# Patient Record
Sex: Female | Born: 1960 | Hispanic: No | Marital: Married
Health system: Midwestern US, Academic
[De-identification: ages and names within clinical notes are randomized; demographics above are authoritative.]

---

## 2018-05-07 LAB — LIPID PROFILE
Lab: 120 — ABNORMAL HIGH (ref <100)
Lab: 203 — ABNORMAL HIGH (ref <200)
Lab: 66 — ABNORMAL HIGH (ref 40–60)

## 2018-05-07 LAB — GLUCOSE, RANDOM: Lab: 83

## 2018-07-22 ENCOUNTER — Encounter: Admit: 2018-07-22 | Discharge: 2018-07-22 | Payer: BC Managed Care – PPO

## 2018-07-25 ENCOUNTER — Encounter: Admit: 2018-07-25 | Discharge: 2018-07-25 | Payer: BC Managed Care – PPO

## 2018-07-25 DIAGNOSIS — R0600 Dyspnea, unspecified: Secondary | ICD-10-CM

## 2018-07-25 DIAGNOSIS — E669 Obesity, unspecified: Secondary | ICD-10-CM

## 2018-07-29 ENCOUNTER — Ambulatory Visit: Admit: 2018-07-29 | Discharge: 2018-07-29 | Payer: BC Managed Care – PPO

## 2018-07-29 ENCOUNTER — Encounter: Admit: 2018-07-29 | Discharge: 2018-07-29 | Payer: BC Managed Care – PPO

## 2018-07-29 DIAGNOSIS — J9859 Other diseases of mediastinum, not elsewhere classified: Secondary | ICD-10-CM

## 2018-07-29 DIAGNOSIS — E669 Obesity, unspecified: Secondary | ICD-10-CM

## 2018-07-29 DIAGNOSIS — R030 Elevated blood-pressure reading, without diagnosis of hypertension: Secondary | ICD-10-CM

## 2018-07-29 DIAGNOSIS — R0600 Dyspnea, unspecified: Secondary | ICD-10-CM

## 2018-08-04 ENCOUNTER — Ambulatory Visit: Admit: 2018-08-04 | Discharge: 2018-08-05 | Payer: BC Managed Care – PPO

## 2018-08-04 DIAGNOSIS — R0600 Dyspnea, unspecified: Secondary | ICD-10-CM

## 2018-08-04 MED ORDER — PERFLUTREN LIPID MICROSPHERES 1.1 MG/ML IV SUSP
1-20 mL | Freq: Once | INTRAVENOUS | 0 refills | Status: CP | PRN
Start: 2018-08-04 — End: ?

## 2018-08-05 ENCOUNTER — Encounter: Admit: 2018-08-05 | Discharge: 2018-08-05 | Payer: BC Managed Care – PPO

## 2018-08-08 ENCOUNTER — Encounter: Admit: 2018-08-08 | Discharge: 2018-08-08 | Payer: BC Managed Care – PPO

## 2018-08-08 DIAGNOSIS — J9859 Other diseases of mediastinum, not elsewhere classified: Secondary | ICD-10-CM

## 2018-08-15 ENCOUNTER — Encounter: Admit: 2018-08-15 | Discharge: 2018-08-15 | Payer: BC Managed Care – PPO

## 2018-08-15 DIAGNOSIS — R030 Elevated blood-pressure reading, without diagnosis of hypertension: Secondary | ICD-10-CM

## 2018-08-15 DIAGNOSIS — Z01812 Encounter for preprocedural laboratory examination: Secondary | ICD-10-CM

## 2018-08-20 ENCOUNTER — Encounter: Admit: 2018-08-20 | Discharge: 2018-08-20 | Payer: BC Managed Care – PPO

## 2018-08-20 DIAGNOSIS — Z01812 Encounter for preprocedural laboratory examination: Secondary | ICD-10-CM

## 2018-08-20 DIAGNOSIS — R030 Elevated blood-pressure reading, without diagnosis of hypertension: Secondary | ICD-10-CM

## 2018-08-20 LAB — CREATININE: Lab: 0.8

## 2018-08-20 LAB — BUN: Lab: 22 — ABNORMAL HIGH (ref 9.8–20.1)

## 2018-08-21 ENCOUNTER — Encounter: Admit: 2018-08-21 | Discharge: 2018-08-21 | Payer: BC Managed Care – PPO

## 2018-08-21 MED ORDER — RANOLAZINE 500 MG PO TB12
500 mg | ORAL_TABLET | Freq: Two times a day (BID) | ORAL | 3 refills | Status: AC
Start: 2018-08-21 — End: 2018-12-04

## 2018-08-25 ENCOUNTER — Encounter: Admit: 2018-08-25 | Discharge: 2018-08-25 | Payer: BC Managed Care – PPO

## 2018-10-06 ENCOUNTER — Encounter: Admit: 2018-10-06 | Discharge: 2018-10-06 | Payer: BC Managed Care – PPO

## 2018-11-27 ENCOUNTER — Encounter: Admit: 2018-11-27 | Discharge: 2018-11-27 | Payer: BC Managed Care – PPO

## 2018-12-03 ENCOUNTER — Encounter: Admit: 2018-12-03 | Discharge: 2018-12-03 | Payer: BC Managed Care – PPO

## 2018-12-04 ENCOUNTER — Encounter: Admit: 2018-12-04 | Discharge: 2018-12-04 | Payer: BC Managed Care – PPO

## 2018-12-04 ENCOUNTER — Ambulatory Visit: Admit: 2018-12-04 | Discharge: 2018-12-05 | Payer: BC Managed Care – PPO

## 2018-12-04 DIAGNOSIS — R0600 Dyspnea, unspecified: Principal | ICD-10-CM

## 2018-12-04 DIAGNOSIS — R030 Elevated blood-pressure reading, without diagnosis of hypertension: Principal | ICD-10-CM

## 2018-12-04 DIAGNOSIS — E669 Obesity, unspecified: ICD-10-CM

## 2018-12-04 MED ORDER — LOSARTAN-HYDROCHLOROTHIAZIDE 50-12.5 MG PO TAB
1 | ORAL_TABLET | Freq: Every morning | ORAL | 3 refills | 28.00000 days | Status: DC
Start: 2018-12-04 — End: 2019-08-07

## 2018-12-04 NOTE — Patient Instructions
1.  Goal is an average of 130/80 or less  2.  Losartan-hydrochlorothiazide 50-12.5/day for blood pressure.  Rx sent to [facility]

## 2018-12-04 NOTE — Progress Notes
SHe's willing to start losartan-HCTZ for her blood pressure.  I sent Rx in today.    Dyspnea  WE haven't found an explanation for her exercise intolerance other than her blood pressure.  She's now willing to try a BP medication.      Current Medications (including today's revisions)  ??? losartan-hydrochlorothiazide (HYZAAR) 50-12.5 mg tablet Take one tablet by mouth every morning.   ??? NO HOME MEDICATIONS

## 2018-12-24 ENCOUNTER — Encounter: Admit: 2018-12-24 | Discharge: 2018-12-24

## 2018-12-26 ENCOUNTER — Encounter: Admit: 2018-12-26 | Discharge: 2018-12-26

## 2018-12-26 NOTE — Patient Instructions
Hydrochlorothiazide, HCTZ; Losartan tablets  Brand Name: Hyzaar  What is this medicine?  LOSARTAN; HYDROCHLOROTHIAZIDE (loe SAR tan; hye droe klor oh THYE a zide) is a combination of a drug that relaxes blood vessels and a diuretic. It is used to treat high blood pressure. This medicine may also reduce the risk of stroke in certain patients.  How should I use this medicine?  Take this medicine by mouth with a glass of water. Follow the directions on the prescription label. You can take it with or without food. If it upsets your stomach, take it with food. Take your medicine at regular intervals. Do not take it more often than directed. Do not stop taking except on your doctor's advice.  Talk to your pediatrician regarding the use of this medicine in children. Special care may be needed.  What side effects may I notice from receiving this medicine?  Side effects that you should report to your doctor or health care professional as soon as possible:  · allergic reactions like skin rash, itching or hives, swelling of the face, lips, or tongue  · breathing problems  · changes in vision  · dark urine  · eye pain  · fast or irregular heart beat, palpitations, or chest pain  · feeling faint or lightheaded  · muscle cramps  · persistent dry cough  · redness, blistering, peeling or loosening of the skin, including inside the mouth  · stomach pain  · trouble passing urine or change in the amount of urine  · unusual bleeding or bruising  · worsened gout pain  · yellowing of the eyes or skin  Side effects that usually do not require medical attention (report to your doctor or health care professional if they continue or are bothersome):  · change in sex drive or performance  · headache  What may interact with this medicine?  · barbiturates, like phenobarbital  · blood pressure medicines  · celecoxib  · cimetidine  · corticosteroids  · diabetic medicines  · diuretics, especially triamterene, spironolactone or amiloride  ·  fluconazole  · lithium  · NSAIDs, medicines for pain and inflammation, like ibuprofen or naproxen  · potassium salts or potassium supplements  · prescription pain medicines  · rifampin  · skeletal muscle relaxants like tubocurarine  · some cholesterol-lowering medicines like cholestyramine or colestipol    What if I miss a dose?  If you miss a dose, take it as soon as you can. If it is almost time for your next dose, take only that dose. Do not take double or extra doses.  Where should I keep my medicine?  Keep out of the reach of children.  Store at room temperature between 15 and 30 degrees C (59 and 86 degrees F). Protect from light. Keep container tightly closed. Throw away any unused medicine after the expiration date.  What should I tell my health care provider before I take this medicine?  They need to know if you have any of these conditions:  · decreased urine  · kidney disease  · liver disease  · if you are on a special diet, like a low-salt diet  · immune system problems, like lupus  · an unusual or allergic reaction to losartan, hydrochlorothiazide, sulfa drugs, other medicines, foods, dyes, or preservatives  · pregnant or trying to get pregnant  · breast-feeding  What should I watch for while using this medicine?  Check your blood pressure regularly while you are taking this   medicine. Ask your doctor or health care professional what your blood pressure should be and when you should contact him or her. When you check your blood pressure, write down the measurements to show your doctor or health care professional. If you are taking this medicine for a long time, you must visit your health care professional for regular checks on your progress. Make sure you schedule appointments on a regular basis.  You must not get dehydrated. Ask your doctor or health care professional how much fluid you need to drink a day. Check with him or her if you get an attack of severe diarrhea, nausea and vomiting, or if you  sweat a lot. The loss of too much body fluid can make it dangerous for you to take this medicine.  Women should inform their doctor if they wish to become pregnant or think they might be pregnant. There is a potential for serious side effects to an unborn child, particularly in the second or third trimester. Talk to your health care professional or pharmacist for more information.  You may get drowsy or dizzy. Do not drive, use machinery, or do anything that needs mental alertness until you know how this drug affects you. Do not stand or sit up quickly, especially if you are an older patient. This reduces the risk of dizzy or fainting spells. Alcohol can make you more drowsy and dizzy. Avoid alcoholic drinks.  This medicine may affect your blood sugar level. If you have diabetes, check with your doctor or health care professional before changing the dose of your diabetic medicine.  Avoid salt substitutes unless you are told otherwise by your doctor or health care professional.  Do not treat yourself for coughs, colds, or pain while you are taking this medicine without asking your doctor or health care professional for advice. Some ingredients may increase your blood pressure.  NOTE:This sheet is a summary. It may not cover all possible information. If you have questions about this medicine, talk to your doctor, pharmacist, or health care provider. Copyright© 2020 [employer]

## 2019-07-09 ENCOUNTER — Encounter: Admit: 2019-07-09 | Discharge: 2019-07-09 | Payer: BC Managed Care – PPO

## 2019-07-09 DIAGNOSIS — E669 Obesity, unspecified: Secondary | ICD-10-CM

## 2019-07-09 DIAGNOSIS — R0600 Dyspnea, unspecified: Secondary | ICD-10-CM

## 2019-08-07 ENCOUNTER — Encounter: Admit: 2019-08-07 | Discharge: 2019-08-07 | Payer: BC Managed Care – PPO

## 2019-08-07 MED ORDER — LOSARTAN-HYDROCHLOROTHIAZIDE 50-12.5 MG PO TAB
1 | ORAL_TABLET | Freq: Every morning | ORAL | 3 refills | 28.00000 days | Status: AC
Start: 2019-08-07 — End: ?

## 2019-08-07 NOTE — Telephone Encounter
Patient called regarding bp's said that she had not been taking bp medication for the past few days because SDO was to review her bp reading and get back with her.  She said bp readings were on her phone.  Listed the lowest bp as 104/80 -129/95, and listed 132/85 refilled current meds

## 2019-09-01 ENCOUNTER — Encounter: Admit: 2019-09-01 | Discharge: 2019-09-01 | Payer: BC Managed Care – PPO

## 2019-09-01 NOTE — Progress Notes
Patient dropped off blood pressure readings. Readings appear mostly within normal range.  No needs at this time.

## 2020-08-08 IMAGING — CR CHEST
3 series · 3 of 3 positions shown · non-contrast
Comparison: none

[rib upper]
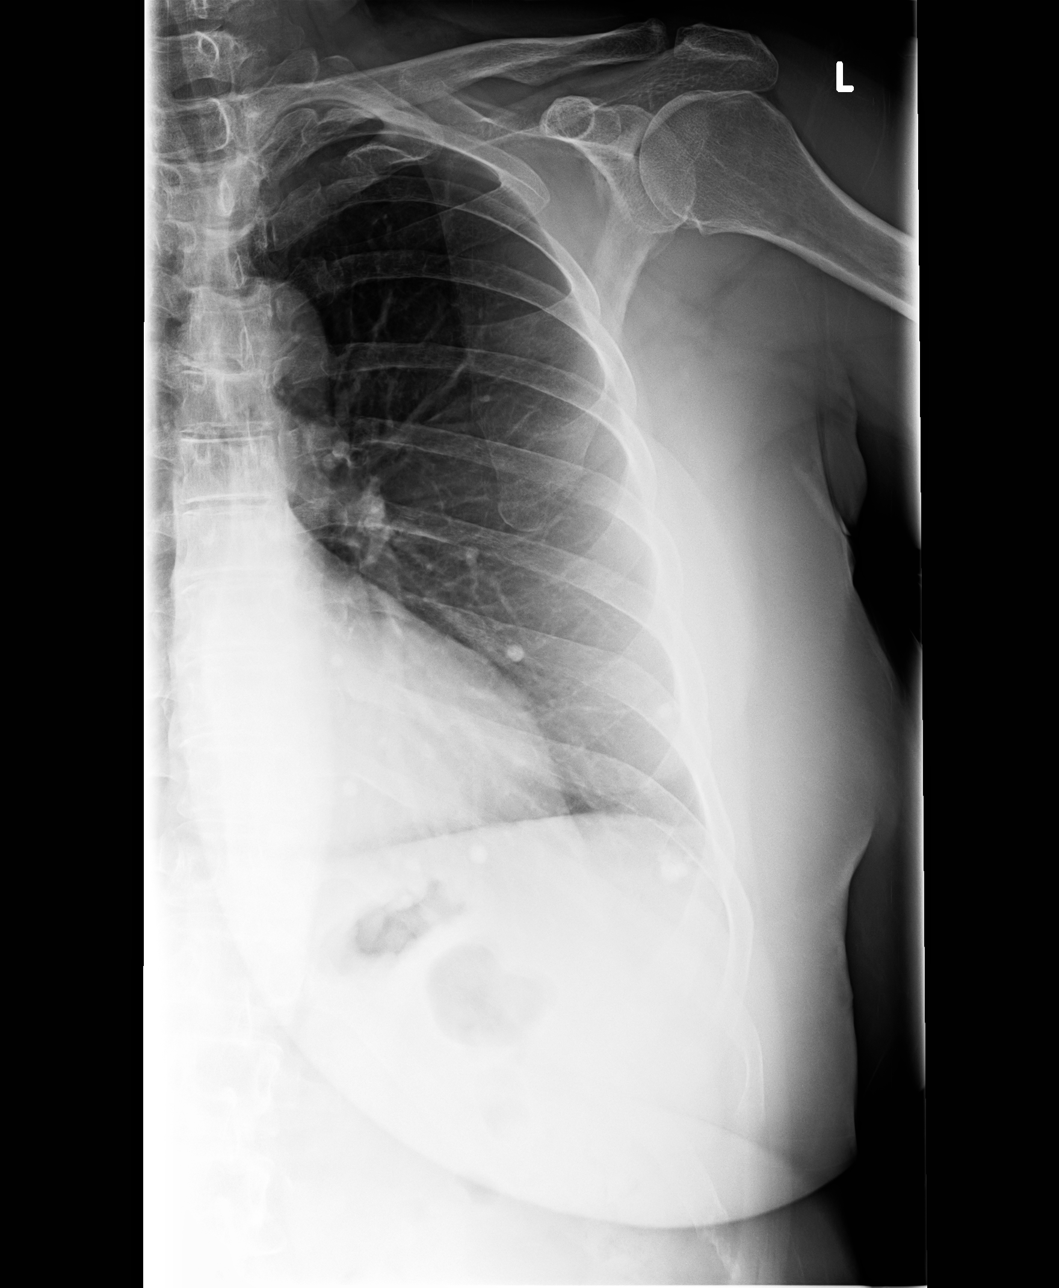

[rib upper obl]
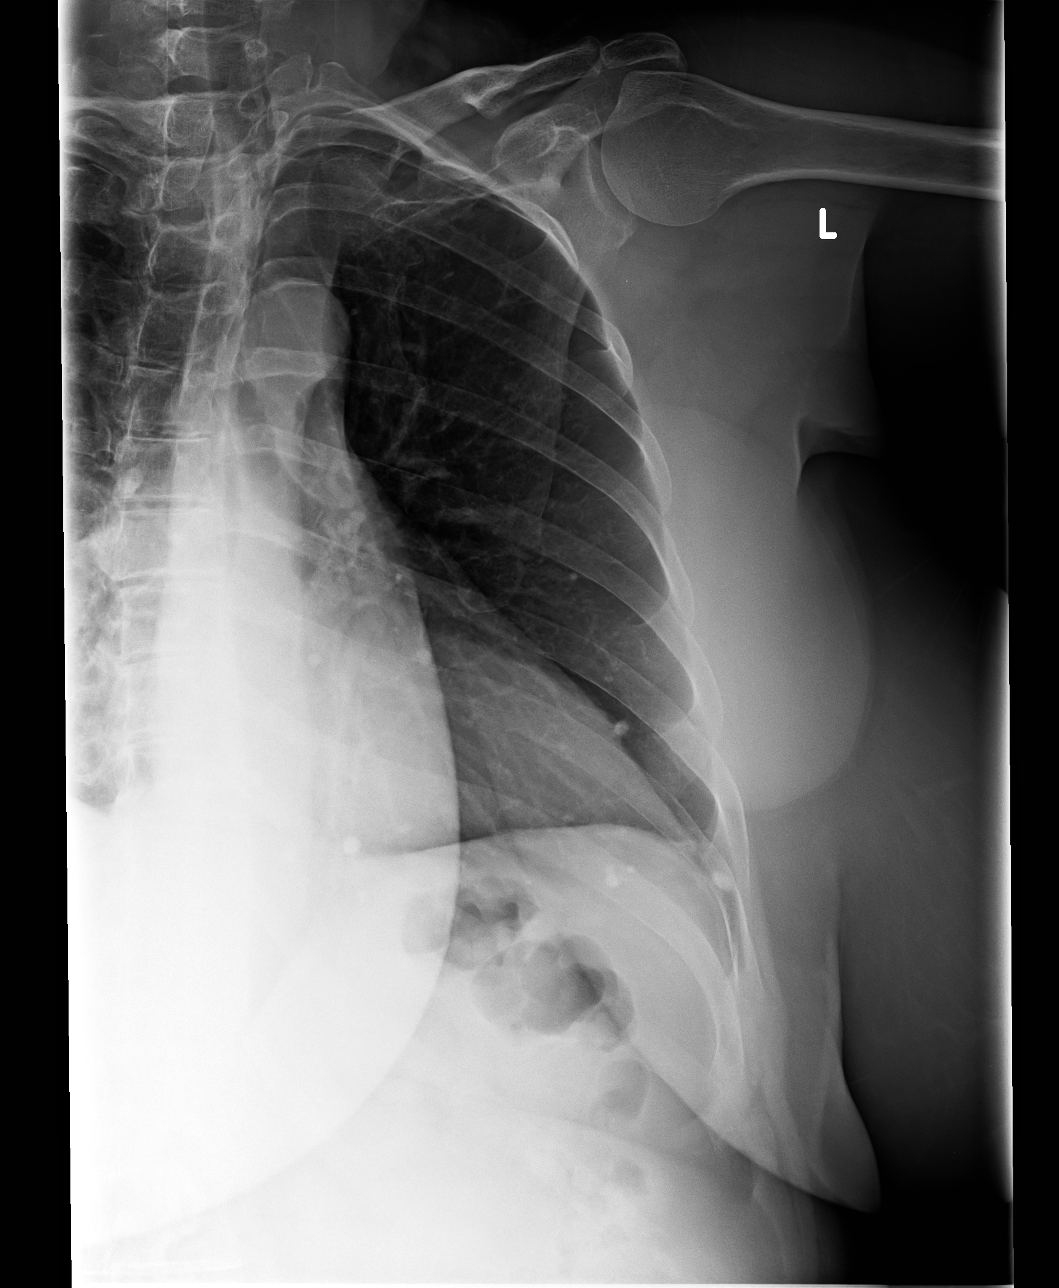

[rib lower]
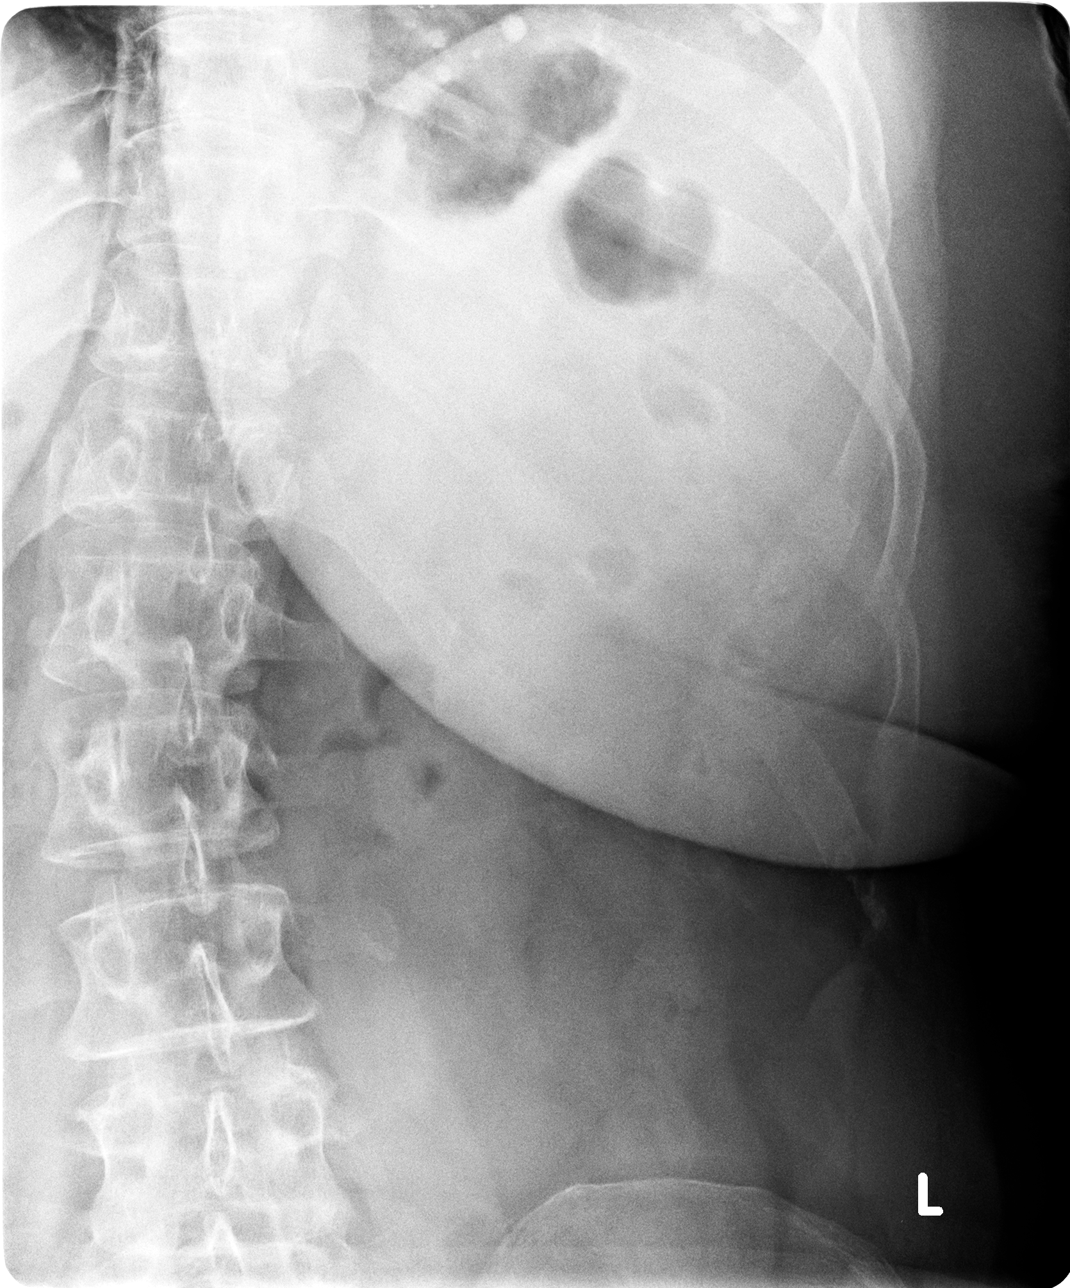

[3 of 3 positions shown; findings below may reference images not displayed]

EXAM

XR ribs, left

INDICATION

fell 11/17/19, injuring back and ribs
FALL X 2 WEEKS AGO. C/O CONTINUED LT POSTERIOR RIB PAIN AND MID BACK PAIN. PAIN W/DEEP INHALATIONS.
HB/TJ

TECHNIQUE

XR ribs, left

COMPARISONS

None available

FINDINGS

There are no displaced left-sided rib fractures. No evidence of pneumothorax. Calcified
granulomata.

IMPRESSION

No displaced left-sided rib fractures.

Tech Notes:

FALL X 2 WEEKS AGO. C/O CONTINUED LT POSTERIOR RIB PAIN AND MID BACK PAIN. PAIN W/DEEP INHALATIONS.
HB/TJ

## 2020-08-11 ENCOUNTER — Encounter: Admit: 2020-08-11 | Discharge: 2020-08-11 | Payer: BC Managed Care – PPO

## 2021-03-30 ENCOUNTER — Encounter: Admit: 2021-03-30 | Discharge: 2021-03-30 | Payer: BC Managed Care – PPO

## 2021-03-30 MED ORDER — LOSARTAN-HYDROCHLOROTHIAZIDE 50-12.5 MG PO TAB
1 | ORAL_TABLET | Freq: Every morning | ORAL | 0 refills | 28.00000 days | Status: AC
Start: 2021-03-30 — End: ?

## 2021-05-30 ENCOUNTER — Encounter: Admit: 2021-05-30 | Discharge: 2021-05-30 | Payer: BC Managed Care – PPO

## 2021-06-01 ENCOUNTER — Encounter: Admit: 2021-06-01 | Discharge: 2021-06-01 | Payer: BC Managed Care – PPO

## 2021-06-01 ENCOUNTER — Encounter: Admit: 2021-06-01 | Discharge: 2021-06-01 | Payer: Private Health Insurance - Indemnity

## 2021-06-01 ENCOUNTER — Ambulatory Visit: Admit: 2021-06-01 | Discharge: 2021-06-02 | Payer: Private Health Insurance - Indemnity

## 2021-06-01 DIAGNOSIS — I10 Essential (primary) hypertension: Secondary | ICD-10-CM

## 2021-06-01 DIAGNOSIS — E669 Obesity, unspecified: Secondary | ICD-10-CM

## 2021-06-01 DIAGNOSIS — R0609 Other forms of dyspnea: Secondary | ICD-10-CM

## 2021-06-01 DIAGNOSIS — R0600 Dyspnea, unspecified: Secondary | ICD-10-CM

## 2021-06-01 MED ORDER — LOSARTAN-HYDROCHLOROTHIAZIDE 50-12.5 MG PO TAB
1 | ORAL_TABLET | Freq: Every morning | ORAL | 3 refills | 28.00000 days | Status: AC
Start: 2021-06-01 — End: ?

## 2021-06-01 NOTE — Progress Notes
Date of Service: 06/01/2021    Pam Booth is a 60 y.o. female.       HPI     Pam Booth was in the [facility] today for follow-up regarding hypertension.  She has gained about 15 pounds since I saw her last.  She has been through a difficult time with breast reduction surgery that resulted in complications.  She is still healing from the incisions.    She knows that her exertional breathlessness is related to weight gain.  Between the pandemic and her surgery she has been unable to exercise for quite a while and this certainly has contributed to her weight gain.    She is not having any palpitations or chest discomfort.  She is having no trouble with peripheral edema or claudication.         Vitals:    06/01/21 1609   BP: (!) 122/90   BP Source: Arm, Left Upper   Pulse: 90   SpO2: 98%   O2 Device: None (Room air)   PainSc: Zero   Weight: 86.2 kg (190 lb)   Height: 162.6 cm (5' 4)     Body mass index is 32.61 kg/m?Marland Kitchen     Past Medical History  Patient Active Problem List    Diagnosis Date Noted   ? Primary hypertension 07/29/2018   ? 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 Dyspnea 07/25/2018     06/20/2018  PFT  Normal baseline spirometry with an FEV1 of 2.77 liters of predicted and FEV1/FVC ratio at 77%.  No improvement post bronchodilator.  Normal lung volumes.  Total lung capacity 118% of predicted.  Normal diffusion at 88% of predicted.  Flow volume loop shows a normal inspiratory and expiratory limb.  Time volume curves are satisfactory.    06/05/2018 CXR  No radiographic evidence of an acute cardiopulmonary process.      ? Obesity 07/25/2018         Review of Systems   Constitutional: Negative.   HENT: Negative.    Eyes: Negative.    Cardiovascular: Negative.    Respiratory: Negative.    Endocrine: Negative.    Hematologic/Lymphatic: Negative.    Skin: Negative.    Musculoskeletal: Negative.    Gastrointestinal: Negative.    Genitourinary: Negative.    Neurological: Negative.    Psychiatric/Behavioral: Negative. Allergic/Immunologic: Negative.        Physical Exam    Physical Exam   General Appearance: no distress   Skin: warm, no ulcers or xanthomas   Digits and Nails: no cyanosis or clubbing   Eyes: conjunctivae and lids normal, pupils are equal and round   Teeth/Gums/Palate: dentition unremarkable, no lesions   Lips & Oral Mucosa: no pallor or cyanosis   Neck Veins: normal JVP , neck veins are not distended   Thyroid: no nodules, masses, tenderness or enlargement   Chest Inspection: chest is normal in appearance   Respiratory Effort: breathing comfortably, no respiratory distress   Auscultation/Percussion: lungs clear to auscultation, no rales or rhonchi, no wheezing   PMI: PMI not enlarged or displaced   Cardiac Rhythm: regular rhythm and normal rate   Cardiac Auscultation: S1, S2 normal, no rub, no gallop   Murmurs: no murmur   Peripheral Circulation: normal peripheral circulation   Carotid Arteries: normal carotid upstroke bilaterally, no bruits   Radial Arteries: normal symmetric radial pulses   Abdominal Aorta: no abdominal aortic bruit   Pedal Pulses: normal symmetric pedal pulses   Lower Extremity Edema: no lower  extremity edema   Abdominal Exam: soft, non-tender, no masses, bowel sounds normal   Liver & Spleen: no organomegaly   Gait & Station: walks without assistance   Muscle Strength: normal muscle tone   Orientation: oriented to time, place and person   Affect & Mood: appropriate and sustained affect   Language and Memory: patient responsive and seems to comprehend information   Neurologic Exam: neurological assessment grossly intact   Other: moves all extremities      Cardiovascular Health Factors  Vitals BP Readings from Last 3 Encounters:   06/01/21 (!) 122/90   07/09/19 102/78   12/04/18 (!) 122/94     Wt Readings from Last 3 Encounters:   06/01/21 86.2 kg (190 lb)   07/09/19 79.8 kg (176 lb)   12/04/18 80.7 kg (178 lb)     BMI Readings from Last 3 Encounters:   06/01/21 32.61 kg/m?   07/09/19 30.21 kg/m?   12/04/18 30.55 kg/m?      Smoking Social History     Tobacco Use   Smoking Status Never Smoker   Smokeless Tobacco Never Used      Lipid Profile Cholesterol   Date Value Ref Range Status   04/30/2019 182  Final     HDL   Date Value Ref Range Status   04/30/2019 48  Final     LDL   Date Value Ref Range Status   04/30/2019 95  Final     Triglycerides   Date Value Ref Range Status   04/30/2019 198 (H) 0 - 150 Final      Blood Sugar No results found for: HGBA1C  Glucose   Date Value Ref Range Status   11/25/2020 113 (H) 70 - 105 Final   04/30/2019 95  Final   05/07/2018 83  Final          Problems Addressed Today  Encounter Diagnoses   Name Primary?   ? Primary hypertension    ? Dyspnea on exertion        Assessment and Plan       Primary hypertension  The goal for her blood pressure is an average of 130/80 or less.  If she is able to lose some weight I think she will probably be there on the current medical therapy.    She is frustrated about her weight and I suggested that she touch base with Melissa Huntington about local options for weight management.    Dyspnea  Her dyspnea does seem to be related to her weight and probably to some degree deconditioning.      Current Medications (including today's revisions)  ? losartan-hydrochlorothiazide (HYZAAR) 50-12.5 mg tablet Take one tablet by mouth every morning. Patient must make an appointment to receive further refills.     Total time spent on today's office visit was 30 minutes.  This includes face-to-face in person visit with patient as well as nonface-to-face time including review of the EMR, outside records, labs, radiologic studies, echocardiogram & other cardiovascular studies, formation of treatment plan, after visit summary, future disposition, and lastly on documentation.

## 2021-06-01 NOTE — Assessment & Plan Note
Her dyspnea does seem to be related to her weight and probably to some degree deconditioning.

## 2021-06-01 NOTE — Assessment & Plan Note
The goal for her blood pressure is an average of 130/80 or less.  If she is able to lose some weight I think she will probably be there on the current medical therapy.    She is frustrated about her weight and I suggested that she touch base with Melissa Huntington about local options for weight management.

## 2021-07-05 ENCOUNTER — Encounter: Admit: 2021-07-05 | Discharge: 2021-07-05 | Payer: Private Health Insurance - Indemnity

## 2021-07-11 ENCOUNTER — Encounter: Admit: 2021-07-11 | Discharge: 2021-07-11 | Payer: Private Health Insurance - Indemnity

## 2021-09-18 ENCOUNTER — Encounter: Admit: 2021-09-18 | Discharge: 2021-09-18 | Payer: Private Health Insurance - Indemnity

## 2021-09-18 NOTE — Telephone Encounter
I spoke with Pam Booth by phone.  I let her know that we will need outside office notes & OP report from the breast reduction she had in May 2022.  Office fax number given.  Teena Dunk, RN

## 2021-09-20 ENCOUNTER — Encounter: Admit: 2021-09-20 | Discharge: 2021-09-20 | Payer: Private Health Insurance - Indemnity

## 2021-09-20 NOTE — Progress Notes
Outside records received via email from Dr. Casimiro Needle Depriest.  Forwarded to Dr. Royston Bake for review.  Teena Dunk, RN

## 2021-10-05 ENCOUNTER — Encounter: Admit: 2021-10-05 | Discharge: 2021-10-05 | Payer: Private Health Insurance - Indemnity

## 2021-10-05 ENCOUNTER — Ambulatory Visit: Admit: 2021-10-05 | Discharge: 2021-10-06 | Payer: Private Health Insurance - Indemnity

## 2021-10-05 DIAGNOSIS — R0600 Dyspnea, unspecified: Secondary | ICD-10-CM

## 2021-10-05 DIAGNOSIS — L905 Scar conditions and fibrosis of skin: Secondary | ICD-10-CM

## 2021-10-05 DIAGNOSIS — E669 Obesity, unspecified: Secondary | ICD-10-CM

## 2021-10-05 DIAGNOSIS — N641 Fat necrosis of breast: Secondary | ICD-10-CM

## 2021-10-05 NOTE — Progress Notes
Subjective:       History of Present Illness  Pam Booth is a 61 y.o. female who presents to discuss reconstruction options after breast reduction surgery complications. Patient reports that she had breast reduction surgery with Dr. Reva Bores last year. Was noted to have dehiscence of her right breast and subsequently underwent wound vac therapy. She notes that she completed therapy and was completely healed in October 2022. Patient also notes that she has lost both of her nipples and has no sensation to the area. She also notes some firm areas in bilateral breasts and has pain in the right breast at this area, but no pain in the left breast. She is also concerned about lateral breast tissue and scarring on the lateral sides of her breasts. She does note she is overall happy with the size of her breasts now but desires improved appearance of her nipples and better lateral contouring/improvement in the scar. Notes she is otherwise healthy except for hypertension. Denies any smoking history of exposure to nicotine at home.       Review of Systems   Constitutional: Negative.    HENT: Negative.    Eyes: Negative.    Respiratory: Negative.    Cardiovascular: Negative.    Gastrointestinal: Negative.    Endocrine: Negative.    Genitourinary: Negative.    Musculoskeletal: Negative.    Skin: Negative.    Allergic/Immunologic: Negative.    Neurological: Negative.    Hematological: Negative.    Psychiatric/Behavioral: Negative.      Medical History:   Diagnosis Date   ? Dyspnea 07/25/2018   ? Obesity 07/25/2018     History reviewed. No pertinent surgical history.  Family History   Problem Relation Age of Onset   ? Pacemaker Mother 63   ? Heart Disease Mother    ? Heart murmur Paternal Aunt      Social History     Socioeconomic History   ? Marital status: Married   Tobacco Use   ? Smoking status: Never   ? Smokeless tobacco: Never   Substance and Sexual Activity   ? Alcohol use: Yes     Comment: Rarely   ? Drug use: Never Objective:         ? losartan-hydrochlorothiazide (HYZAAR) 50-12.5 mg tablet Take one tablet by mouth every morning. Patient must make an appointment to receive further refills.     Vitals:    10/05/21 1004   BP: (!) 140/98   Pulse: 100   PainSc: Zero   Weight: 81.6 kg (180 lb)   Height: 162.6 cm (5' 4)     Body mass index is 30.9 kg/m?Marland Kitchen     Physical Exam  Vitals reviewed.   HENT:      Head: Atraumatic.   Cardiovascular:      Rate and Rhythm: Normal rate.   Pulmonary:      Effort: Pulmonary effort is normal.   Chest:       Abdominal:      General: Abdomen is flat.   Skin:     General: Skin is warm.      Capillary Refill: Capillary refill takes less than 2 seconds.   Neurological:      General: No focal deficit present.      Mental Status: She is alert.   Psychiatric:         Mood and Affect: Mood normal.         Genera: NAD, alert  Breast: Right breast with scarring along  the IMF incision line, no nipple areolar complex. Firm area that is tender at the 11 oclock position of her breast. Has some lateral fullness and scarring of her lateral chest wall. Left breast with well healed incsions, loss of her NAC. Does have an nontender firm area at the 11 oclock position. Lateral breast fullness and scarring.      Assessment and Plan:    61 year old female with unfortunate number of complications following breast reduction surgery. She sustained loss of bilateral nipples with sensation loss. She also had wound dehiscence with prolonged course of wound vac therapy which she has now completed. She does have some areas of fat necrosis in bilateral breasts; however, the right breast is the only side she has pain in. There is also some excess lateral breast tissue and scarring which patient would like improved. In regards to the area of fat necrosis we discussed that surgery would entail removal of the necrotic fat; this would leave a resultant deformity that could later be fat grafted to address the contour deformity. We also discussed nipple reconstruction options with local flaps vs. Nipple tattooing. We discussed that with local flaps she would have nipples that would be constantly erect appearing which may affect her ability to wear certain garments. We further discussed that it is unlikely that she will have return of nipple sensation at this point. In regards to the lateral breast deformities; we discussed scar revisions; although would wait until patient is at least 1 year out from completion of her healing. At this time patient would like to consider her options and will contact us about scheduling any procedures.    Problem   Scar   Fat Necrosis of Both Breasts            ATTESTATION    I personally performed the key portions of the E/M visit, discussed case with resident, nurse practioner, or physician assistant and concur with documentation of history, physical exam, assessment, and treatment plan unless otherwise noted.    Staff name:  Lucas Mallow, MD Date:  10/05/2021       Micah Flesher over the limitations and some options for revisionary surgery including:  Nipple reconstruction versus tattoo  Excision of fat necrosis with staged fat grafting  Scar revision

## 2022-09-23 ENCOUNTER — Encounter: Admit: 2022-09-23 | Discharge: 2022-09-23 | Payer: Private Health Insurance - Indemnity

## 2022-09-23 MED ORDER — LOSARTAN-HYDROCHLOROTHIAZIDE 50-12.5 MG PO TAB
1 | ORAL_TABLET | ORAL | 0 refills
Start: 2022-09-23 — End: ?

## 2022-09-27 ENCOUNTER — Encounter: Admit: 2022-09-27 | Discharge: 2022-09-27 | Payer: Private Health Insurance - Indemnity

## 2022-09-27 MED ORDER — LOSARTAN-HYDROCHLOROTHIAZIDE 50-12.5 MG PO TAB
1 | ORAL_TABLET | ORAL | 0 refills | 28.00000 days | Status: AC
Start: 2022-09-27 — End: ?

## 2023-08-29 ENCOUNTER — Encounter: Admit: 2023-08-29 | Discharge: 2023-08-29 | Payer: Private Health Insurance - Indemnity
